# Patient Record
Sex: Male | Born: 1950 | Race: White | Hispanic: No | Marital: Married | Smoking: Never smoker
Health system: Southern US, Community
[De-identification: ages and names within clinical notes are randomized; demographics above are authoritative.]

## PROBLEM LIST (undated history)

## (undated) DIAGNOSIS — M1990 Unspecified osteoarthritis, unspecified site: Secondary | ICD-10-CM

## (undated) DIAGNOSIS — E119 Type 2 diabetes mellitus without complications: Secondary | ICD-10-CM

## (undated) DIAGNOSIS — E785 Hyperlipidemia, unspecified: Secondary | ICD-10-CM

## (undated) DIAGNOSIS — I10 Essential (primary) hypertension: Secondary | ICD-10-CM

## (undated) HISTORY — PX: PROSTATE SURGERY: SHX751

## (undated) HISTORY — DX: Hyperlipidemia, unspecified: E78.5

## (undated) HISTORY — DX: Unspecified osteoarthritis, unspecified site: M19.90

## (undated) HISTORY — DX: Essential (primary) hypertension: I10

## (undated) HISTORY — DX: Type 2 diabetes mellitus without complications: E11.9

---

## 2020-01-15 ENCOUNTER — Encounter (INDEPENDENT_AMBULATORY_CARE_PROVIDER_SITE_OTHER): Payer: Self-pay

## 2020-01-16 ENCOUNTER — Ambulatory Visit (INDEPENDENT_AMBULATORY_CARE_PROVIDER_SITE_OTHER): Payer: Self-pay

## 2020-01-16 DIAGNOSIS — Z23 Encounter for immunization: Secondary | ICD-10-CM

## 2020-02-06 ENCOUNTER — Encounter (INDEPENDENT_AMBULATORY_CARE_PROVIDER_SITE_OTHER): Payer: Self-pay

## 2020-02-07 ENCOUNTER — Encounter (INDEPENDENT_AMBULATORY_CARE_PROVIDER_SITE_OTHER): Payer: Self-pay

## 2020-02-08 ENCOUNTER — Ambulatory Visit (INDEPENDENT_AMBULATORY_CARE_PROVIDER_SITE_OTHER): Payer: Self-pay

## 2020-02-08 DIAGNOSIS — Z23 Encounter for immunization: Secondary | ICD-10-CM

## 2021-08-27 ENCOUNTER — Encounter (INDEPENDENT_AMBULATORY_CARE_PROVIDER_SITE_OTHER): Payer: Self-pay | Admitting: Cardiovascular Disease

## 2021-08-27 ENCOUNTER — Ambulatory Visit (INDEPENDENT_AMBULATORY_CARE_PROVIDER_SITE_OTHER): Payer: No Typology Code available for payment source | Admitting: Cardiovascular Disease

## 2021-08-27 VITALS — BP 154/90 | HR 66 | Ht 72.0 in | Wt 210.0 lb

## 2021-08-27 DIAGNOSIS — I4510 Unspecified right bundle-branch block: Secondary | ICD-10-CM

## 2021-08-27 DIAGNOSIS — I2584 Coronary atherosclerosis due to calcified coronary lesion: Secondary | ICD-10-CM

## 2021-08-27 DIAGNOSIS — R9431 Abnormal electrocardiogram [ECG] [EKG]: Secondary | ICD-10-CM

## 2021-08-27 NOTE — Progress Notes (Signed)
[facility] OFF[facility] CONSULTATION NOTE    [facility] OFFICE -CARDIOLOGY  [address]  [place] [address]  Dept: [phone]  Dept Fax: [phone]     Patient Name: Allen Oliver    Date of Visit:  August 27, 2021  Date of Birth: 1950-12-02  AGE: 70 y.o.  Medical Record #: [id number]  Requesting Physician: Everardo Beals, MD    CHIEF COMPLAINT:  Abnormal ECG      HISTORY OF PRESENT ILLNESS    Allen Oliver is being seen today for cardiovascular evaluation at the request of Everardo Beals, MD. He is a pleasant 70 y.o. male who has been referred for cardiovascular evaluation of an abnormal ECG.    [place] is [place].  Still working full-time.  Asymptomatic with no chest pain.    Has a quite active physical fitness routine doing stationary bike for 1 hour at a brisk pace 5 days weekly without limitations.  He knowledges his diet could be better and he would like to lose 10 pounds as well.  He has cut out meat and is just eating vegetables and fish for the past 14 days.  He is going to try to continue this.    I reviewed recent lipids showing LDL at 154.  This prompted his PCP to initiate atorvastatin.    He does not currently endorse any chest pain, shortness of breath, dyspnea on exertion, orthopnea, PND, edema, palpitations, nausea, diaphoresis, light-headedness, dizziness, syncope, or unusual bleeding.    PAST MEDICAL HISTORY: He has no past medical history on file. He has no past surgical history on file.    ALLERGIES: Not on File    MEDICATIONS:   Current Outpatient Medications   Medication Instructions    atorvastatin (LIPITOR) 20 mg, Oral, At bedtime    metFORMIN (GLUCOPHAGE) 500 mg, Oral, 2 times daily    testosterone cypionate (DEPO-TESTOSTERONE) 200 MG/ML injection INJECT INTRAMSCULAR TWICE A MONTH    testosterone cypionate (DEPO-TESTOSTERONE) 200 MG/ML injection testosterone cypionate 200 mg/mL intramuscular oil        FAMILY  HISTORY: family history is not on file.    SOCIAL HISTORY: He reports that he has never smoked. He has never used smokeless tobacco.    REVIEW OF SYSTEMS:   General: Denies recent weight loss, weight gain, fever or chills or change in exercise tolerance.;   Integumentary: Denies any change in hair or nails, rashes, or skin lesions.;   Eyes: Denies diplopia, glaucoma or visual field defects.;   Ears, Nose, Throat, Mouth: Denies any hearing loss, epistaxis, hoarseness or difficulty speaking.;  Respiratory: Denies dyspnea, cough, wheezing or hemoptysis.;   Cardiovascular: Please review HPI;   Abdominal : Denies ulcer disease, hematochezia or melena.;  Musculoskeletal:Denies any venous insufficiency, arthritic symptoms or back problems.;   Neurological : Denies any recurrent strokes, TIA, or seizure disorder.;   Psychiatric: Denies any depression, substance abuse or change in cognitive functions.;   Endocrine: Denies any weight change, heat/cold intolerance, polydipsia, or polyuria;   Hematologic/Immunologic: Denies any food allergies, seasonal allergies, bleeding disorders.   All other systems reviewed and negative except as above.     PHYSICAL EXAMINATION  Visit Vitals  BP 154/90 (BP Site: Left arm, Patient Position: Sitting, Cuff Size: Small)   Pulse 66   Ht 1.829 m (6')   Wt 95.3 kg (210 lb)   BMI 28.48 kg/m      GENERAL: NAD,  appears stated age  HEENT: No scleral icterus or conjunctival pallor, moist mucous membranes   NECK: No JVD, normal carotid upstrokes without bruits   CARDIAC: Normal rate, regular rhythm, normal S1 and S2, and no murmurs, rubs, or gallops   CHEST: Clear to auscultation bilaterally with normal respiratory effort, no wheezes or rales  ABDOMEN: Soft, non-tender, non-distended, normal bowel sounds  EXTREMITIES: No edema, 2+ DP/radial pulses bilaterally  SKIN: No rash or jaundice; warm and dry to touch  NEUROLOGIC: Alert and oriented to time, place and person; normal mood and affect; no gross  motor or sensory deficits noted  MUSCULOSKELETAL: Normal muscle strength and tone bilaterally/symmetrically    ECG (September 30th, 2022 @ PCP): Normal sinus rhythm with right bundle branch block, possible left atrial enlargement, normal axis      IMPRESSION:   Allen Oliver is a 70 y.o. 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  Subclinical coronary calcification by calcium scoring on August 23, 2021, with calcium score 95 (41st percentile).  Dyslipidemia with LDL 154, HDL 51, triglycerides 91, total cholesterol 223 on recent PCP labs in September, 2022 - appropriately started on atorvastatin 20 mg based on those numbers.  Elevated blood pressure reading without a diagnosis of hypertension, though suspect he may actually have stage I hypertension based on his account of ambulatory monitor readings generally in the 140s-150s/80s.  Prediabetes with hemoglobin A1c 6.0% in September, 2022.      RECOMMENDATIONS:    Echocardiogram to assess for structural heart disease with particular attention to the right ventricle/pulmonary pressure/tricuspid valve as well as potential parameters indicative of hypertensive heart disease.  Treadmill nuclear stress testing for coronary ischemic screening given risk profile.  If there is evidence of hypertensive heart disease on the echocardiogram and/work a hypertensive exercise response during his stress test, I would strongly advise he is labeled stage I hypertension and treated accordingly.  Risk factor modification is ongoing with Dr. Jeannene Patella patient seems quite motivated to improve dietary habits and drop another 10 pounds or so.  He states historically this has had significantly impactful improvements on the blood pressure.                                                 Orders Placed This Encounter   Procedures    NM Myocardial Perfusion Spect (Stress And Rest)    Echocardiogram Adult Complete W Clr/ Dopp Waveform         No orders of the  defined types were placed in this encounter.        SIGNED:    Arcola Jansky, MD         This note was generated by the Dragon speech recognition and may contain errors or omissions not intended by the user. Grammatical errors, random word insertions, deletions, pronoun errors, and incomplete sentences are occasional consequences of this technology due to software limitations. Not all errors are caught or corrected. If there are questions or concerns about the content of this note or information contained within the body of this dictation, they should be addressed directly with the author for clarification.

## 2021-09-02 ENCOUNTER — Encounter (INDEPENDENT_AMBULATORY_CARE_PROVIDER_SITE_OTHER): Payer: Self-pay

## 2022-01-06 ENCOUNTER — Encounter (INDEPENDENT_AMBULATORY_CARE_PROVIDER_SITE_OTHER): Payer: Self-pay | Admitting: Cardiovascular Disease

## 2022-01-24 ENCOUNTER — Inpatient Hospital Stay
Admission: RE | Admit: 2022-01-24 | Discharge: 2022-01-24 | Disposition: A | Discharge status: Home / Self Care | Payer: No Typology Code available for payment source | Source: Ambulatory Visit | Attending: Cardiovascular Disease | Admitting: Cardiovascular Disease

## 2022-01-24 DIAGNOSIS — E785 Hyperlipidemia, unspecified: Secondary | ICD-10-CM | POA: Insufficient documentation

## 2022-01-24 DIAGNOSIS — I2510 Atherosclerotic heart disease of native coronary artery without angina pectoris: Secondary | ICD-10-CM | POA: Insufficient documentation

## 2022-01-24 DIAGNOSIS — I2584 Coronary atherosclerosis due to calcified coronary lesion: Secondary | ICD-10-CM | POA: Insufficient documentation

## 2022-01-24 DIAGNOSIS — R931 Abnormal findings on diagnostic imaging of heart and coronary circulation: Secondary | ICD-10-CM | POA: Insufficient documentation

## 2022-01-24 DIAGNOSIS — R9431 Abnormal electrocardiogram [ECG] [EKG]: Secondary | ICD-10-CM | POA: Insufficient documentation

## 2022-01-24 DIAGNOSIS — I4510 Unspecified right bundle-branch block: Secondary | ICD-10-CM | POA: Insufficient documentation

## 2022-01-24 MED ORDER — TECHNETIUM TC 99M TETROFOSMIN IV KIT
10.0000 | PACK | Freq: Once | INTRAVENOUS | Status: AC | PRN
Start: 2022-01-24 — End: 2022-01-24
  Administered 2022-01-24: 07:00:00 10 via INTRAVENOUS
  Filled 2022-01-24: qty 100

## 2022-01-24 MED ORDER — TECHNETIUM TC 99M TETROFOSMIN IV KIT
35.0000 | PACK | Freq: Once | INTRAVENOUS | Status: AC | PRN
Start: 2022-01-24 — End: 2022-01-24
  Administered 2022-01-24: 08:00:00 35 via INTRAVENOUS
  Filled 2022-01-24: qty 100

## 2022-01-25 ENCOUNTER — Encounter (INDEPENDENT_AMBULATORY_CARE_PROVIDER_SITE_OTHER): Payer: Self-pay | Admitting: Cardiovascular Disease

## 2022-02-17 ENCOUNTER — Encounter (INDEPENDENT_AMBULATORY_CARE_PROVIDER_SITE_OTHER): Payer: Self-pay | Admitting: Cardiovascular Disease

## 2022-02-17 ENCOUNTER — Ambulatory Visit
Admission: RE | Admit: 2022-02-17 | Discharge: 2022-02-17 | Disposition: A | Discharge status: Home / Self Care | Payer: No Typology Code available for payment source | Source: Ambulatory Visit | Attending: Cardiovascular Disease | Admitting: Cardiovascular Disease

## 2022-02-17 DIAGNOSIS — I77810 Thoracic aortic ectasia: Secondary | ICD-10-CM | POA: Insufficient documentation

## 2022-02-17 DIAGNOSIS — R9431 Abnormal electrocardiogram [ECG] [EKG]: Secondary | ICD-10-CM | POA: Insufficient documentation

## 2022-02-17 DIAGNOSIS — I4510 Unspecified right bundle-branch block: Secondary | ICD-10-CM | POA: Insufficient documentation

## 2022-02-17 DIAGNOSIS — I2584 Coronary atherosclerosis due to calcified coronary lesion: Secondary | ICD-10-CM | POA: Insufficient documentation

## 2022-02-17 DIAGNOSIS — I5189 Other ill-defined heart diseases: Secondary | ICD-10-CM | POA: Insufficient documentation

## 2022-02-17 DIAGNOSIS — I517 Cardiomegaly: Secondary | ICD-10-CM | POA: Insufficient documentation

## 2022-02-21 ENCOUNTER — Encounter (INDEPENDENT_AMBULATORY_CARE_PROVIDER_SITE_OTHER): Payer: Self-pay | Admitting: Cardiovascular Disease

## 2022-10-19 ENCOUNTER — Encounter (INDEPENDENT_AMBULATORY_CARE_PROVIDER_SITE_OTHER): Payer: Self-pay

## 2023-06-26 ENCOUNTER — Ambulatory Visit: Payer: No Typology Code available for payment source

## 2023-06-26 NOTE — PSS Phone Screening (Signed)
Pre-Anesthesia Evaluation    Pre-op phone visit requested by:   Reason for pre-op phone visit: Patient anticipating RIGHT ARTHROPLASTY, SHOULDER REVERSE, REPAIR, TENDON, MAJOR procedure.         No orders of the defined types were placed in this encounter.      History of Present Illness/Summary:        Problem List:  Medical Problems       Hospital Problem List  Date Reviewed: 08/27/2021   None        Non-Hospital Problem List  Date Reviewed: 08/27/2021   None       Medical History   Diagnosis Date    Arthritis     Hyperlipidemia     Hypertension     Type 2 diabetes mellitus, controlled      Past Surgical History:   Procedure Laterality Date    PROSTATE SURGERY          Medication List            Accurate as of June 26, 2023  9:15 AM. Always use your most recent med list.                atorvastatin 20 MG tablet  Take 1 tablet (20 mg) by mouth nightly  Commonly known as: LIPITOR  Medication Adjustments for Surgery: Take as prescribed     metFORMIN 500 MG tablet  Take 1 tablet (500 mg) by mouth daily  Commonly known as: GLUCOPHAGE  Medication Adjustments for Surgery: Take morning of surgery     olmesartan 40 MG tablet  Take 1 tablet (40 mg) by mouth nightly as directed  Commonly known as: BENICAR  Medication Adjustments for Surgery: Take as prescribed     Ozempic (0.25 or 0.5 MG/DOSE) 2 MG/3ML  Inject 0.25 mg into the skin Once each week on Friday morning  Generic drug: semaglutide  Medication Adjustments for Surgery: Stop now  Notes to patient: Last dose was 06/23/23     sildenafil 20 MG tablet  Take 1 tablet (20 mg) by mouth as needed  Commonly known as: REVATIO  Medication Adjustments for Surgery: Last dose 24 hours before surgery     testosterone cypionate 200 MG/ML injection  INJECT INTRAMSCULAR TWICE A MONTH  Commonly known as: DEPO-TESTOSTERONE  Medication Adjustments for Surgery: Take as prescribed            Allergies[1]  History reviewed. No pertinent family history.  Social History     Occupational  History    Not on file   Tobacco Use    Smoking status: Never    Smokeless tobacco: Never   Vaping Use    Vaping status: Never Used   Substance and Sexual Activity    Alcohol use: Never    Drug use: Never    Sexual activity: Not on file           Exam Scores:   SDB score           STBUR score       PONV score  Nausea Risk: MODERATE RISK    MST score  MST Score: 0    PEN-FAST score       Frailty score  CFS Score: 3    CHADsVasc            Visit Vitals  Ht 1.829 m (6')   Wt 95.3 kg (210 lb)   BMI 28.48 kg/m   Overweight based on BMI.                                     [  1] No Known Allergies

## 2023-07-05 NOTE — Discharge Instr - AVS First Page (Addendum)
Dr. Nagda's Post-Op Instructions for Shoulder Replacement - Outpatient        DIET  * You may resume clear liquids and light foods after surgery (jello, soups, etc.)  * Progress to your normal diet as tolerated if you are not nauseated      PAIN MEDICATIONS    * Your nerve block will keep your arm numb after surgery for approximately 24-36 hours and possibly longer.      * Start taking the prescribed pain medication between: ____________________________________________ if you have any pain, OR take your first pill when your pain level is a 1 out of 10. You want to get ahead of the pain while the block is still working. Waiting until the block completely wears off may result in the medication not being effective enough to manage the pain.   * Common side effects of the pain medication are nausea, drowsiness, itching, and constipation. Take medication with food to decrease these effects.   * If you have persistent nausea or vomiting, contact the office and we may be able to switch your medication or prescribe you an anti-nausea medication.  * We will prescribe an anti-nausea medication. Take this about 20-30 minutes before the pain medication.   * We strongly recommend taking an over-the-counter laxative and stool softener while on pain medication.  * Over the counter Benadryl can be used for itching.  * Do not drive a car or operate machinery while taking narcotic medication  * Ibuprofen (motrin, advil) up to 400mg may be taken every 6 hours in addition to the narcotic pain medication to assist in pain relief if you are not taking a blood thinner. Do not take more than 1,600 mg in a 24 hour period.   * Tylenol(Acetaminophen) 500-1000mg (1-2 tablets) may be taken every 6 hours in addition to the narcotic pain medication to assist in pain relief. Do not take more than 3,000mg of tylenol(acetaminophen) in a 24 hour period.  * Tylenol and Ibuprofen can be taken together or alternating every 3 hours.  You should not take  Alleve or any other NSAID (anti-inflammatory med) if you are taking Ibuprofen.  If you have a regular NSAID (anti-inflammatory med) that you prefer, you can take this in lieu of Ibuprofen.        BLOOD THINNING MEDICATIONS  *If you take a blood thinning medication you will resume this medication after surgery based on your prescribing doctors instructions.      *If you do NOT take a blood thinning medication:   PLEASE TAKE A BABY ASPIRIN (81MG) TWO TIMES PER DAY until seen back in the office, this will aid in the prevention of blood clots    *You should alert Dr. Nagda if you have a history of bleeding disorders, bleeding ulcers, blood clots, are already taking a blood thinner or have intolerance to Aspirin.     WOUND CARE  * Remove the dressing 7 days after surgery. Your dressing will consist of white gauze with clear tape over the top. Surgical glue is also used on the incision. It is normal to see a film over the incision after dressing removal. Do not try to remove this glue.   * To shower, apply a large piece of saran wrap over the shoulder. Remove the wrap once the shower is complete. PLEASE KEEP YOUR SHOULDER DRY UNTIL YOUR FIRST POST-OP APPOINTMENT.  * Do not submerge the incisions in a bath or pool until instructed by Dr. Nagda.   *   Do not apply any creams, lotions, or ointments to the surgical incision until instructed to do so by Dr. Nagda.  * You may remove the sling to shower even if instructed to keep it on at all times. Please keep your arm by your side when out of the sling to shower.    ACTIVITY  * You will find that sleeping in a slightly upright position (i.e. reclining chair) with a pillow under the forearm and/or behind the shoulder will likely be the most comfortable position.  * Avoid long distance traveling and being seated or lying for long periods of time after surgery.   * NO driving until you have been seen back in the office.  * You may return to sedentary work ONLY or school 7-10 days  after surgery, if your pain is tolerable.  * We encourage you to walk around and be active as soon as you feel comfortable.  However, do not use the arm that has been operated on for any significant activity.    * You can use your hand for writing and typing, but no lifting or carrying with the arm that has undergone surgery.   * You should wear your sling at all times except for showering and performing gentle elbow ROM exercises. These exercises should be shown to you at your pre-op appointment or in the recovery room.  * You may remove the sling and move your elbow and wrist 4-5 times per day.    * You must avoid twisting your arm out to the side or reaching your arm behind your back until cleared to do so by Dr. Nagda.    COLD THERAPY   * Begin using ice packs or machine immediately after surgery.   * Ice should be applied no more than every hour for 20 minutes, (while awake) until your first post-operative visit. Do not place the ice packs directly in contact with skin.  EXERCISE  * Perform gentle deep breathing exercises, (take a deep breath and fully exhale) 5-10 times per hour during the first several days after surgery.   *You will be shown some gentle shoulder exercises at your pre-op appointment with Dr. Nagda or in the recovery room. You should continue these until your first post-op visit.  * You may come out of the sling 4-5 times per day to move the elbow and wrist.   * Begin Physical Therapy after your first post-op visit with Dr. Nagda or his PA. Please call to make this PT appointment prior to surgery. We will give you the prescription for PT at your first post-op visit.  * Once you start therapy, the therapist will show you more exercises to perform at home. These should be done 3-4 times per day.     THINGS TO LOOK OUT FOR  * Contact Dr. Nagda or the on-call physician at [phone] at any time if you notice any of the following signs or symptoms:    * Painful swelling that persists beyond 24  hours  * Unrelenting pain that is not relieved by the medication  * Fever over 101.5F after the first 2 days following surgery. You may have a fever within the first 2 days of surgery. This may be a sign of inactivity. Make sure you are performing your deep breathing exercises.   * Redness around the incisions  * Color change or excessive swelling in your wrist or hand  * Continuous drainage or bleeding from the surgical wound that   persists after the first 4-5 days after surgery (a small amount of drainage is expected)  * Difficulty breathing  * Excessive nausea/vomiting  * Swelling in either of your legs  * Anything else that concerns you    FOLLOW-UP CARE/QUESTIONS  * We will give you a call the first day after your surgery to see how you are doing. We'll be happy to answer any questions or address any concerns at that time. Our office number is [phone].

## 2023-07-06 ENCOUNTER — Ambulatory Visit
Payer: No Typology Code available for payment source | Admitting: Student in an Organized Health Care Education/Training Program

## 2023-07-06 ENCOUNTER — Ambulatory Visit: Payer: No Typology Code available for payment source

## 2023-07-06 ENCOUNTER — Ambulatory Visit
Admission: RE | Admit: 2023-07-06 | Discharge: 2023-07-06 | Disposition: A | Discharge status: Home / Self Care | Payer: No Typology Code available for payment source | Source: Ambulatory Visit | Attending: Orthopaedic Surgery | Admitting: Orthopaedic Surgery

## 2023-07-06 ENCOUNTER — Encounter
Admission: RE | Disposition: A | Discharge status: Home / Self Care | Payer: Self-pay | Source: Ambulatory Visit | Attending: Orthopaedic Surgery

## 2023-07-06 ENCOUNTER — Encounter: Payer: Self-pay | Admitting: Orthopaedic Surgery

## 2023-07-06 DIAGNOSIS — E782 Mixed hyperlipidemia: Secondary | ICD-10-CM | POA: Insufficient documentation

## 2023-07-06 DIAGNOSIS — Z7985 Long-term (current) use of injectable non-insulin antidiabetic drugs: Secondary | ICD-10-CM | POA: Insufficient documentation

## 2023-07-06 DIAGNOSIS — M75111 Incomplete rotator cuff tear or rupture of right shoulder, not specified as traumatic: Secondary | ICD-10-CM | POA: Insufficient documentation

## 2023-07-06 DIAGNOSIS — M7521 Bicipital tendinitis, right shoulder: Secondary | ICD-10-CM | POA: Diagnosis present

## 2023-07-06 DIAGNOSIS — I10 Essential (primary) hypertension: Secondary | ICD-10-CM | POA: Insufficient documentation

## 2023-07-06 DIAGNOSIS — M19011 Primary osteoarthritis, right shoulder: Secondary | ICD-10-CM | POA: Diagnosis present

## 2023-07-06 DIAGNOSIS — E230 Hypopituitarism: Secondary | ICD-10-CM | POA: Insufficient documentation

## 2023-07-06 DIAGNOSIS — E119 Type 2 diabetes mellitus without complications: Secondary | ICD-10-CM | POA: Insufficient documentation

## 2023-07-06 DIAGNOSIS — Z7984 Long term (current) use of oral hypoglycemic drugs: Secondary | ICD-10-CM | POA: Insufficient documentation

## 2023-07-06 HISTORY — PX: REPAIR, TENDON, MAJOR: SHX5316

## 2023-07-06 HISTORY — PX: ARTHROPLASTY SHOULDER REVERSE: SHX51021

## 2023-07-06 LAB — WHOLE BLOOD GLUCOSE POCT: Whole Blood Glucose POCT: 111 mg/dL — ABNORMAL HIGH (ref 70–100)

## 2023-07-06 SURGERY — ARTHROPLASTY, SHOULDER REVERSE
Anesthesia: Anesthesia General | Site: Shoulder | Laterality: Right | Wound class: Clean

## 2023-07-06 MED ORDER — GLYCOPYRROLATE 0.2 MG/ML IJ SOLN (WRAP)
INTRAMUSCULAR | Status: DC | PRN
Start: 2023-07-06 — End: 2023-07-06
  Administered 2023-07-06: .2 mg via INTRAVENOUS

## 2023-07-06 MED ORDER — ACETAMINOPHEN 500 MG PO TABS
1000.0000 mg | ORAL_TABLET | Freq: Four times a day (QID) | ORAL | Status: DC | PRN
Start: 2023-07-06 — End: 2023-07-06

## 2023-07-06 MED ORDER — PHENYLEPHRINE 100 MCG/ML IV SYRINGE FOR INFUSION (ANESTHESIA)
PREFILLED_SYRINGE | INTRAVENOUS | Status: DC | PRN
Start: 2023-07-06 — End: 2023-07-06
  Administered 2023-07-06: 25 ug/min via INTRAVENOUS

## 2023-07-06 MED ORDER — FENTANYL CITRATE (PF) 50 MCG/ML IJ SOLN (WRAP)
INTRAMUSCULAR | Status: DC | PRN
Start: 2023-07-06 — End: 2023-07-06
  Administered 2023-07-06: 100 ug via INTRAVENOUS

## 2023-07-06 MED ORDER — MIDAZOLAM HCL 1 MG/ML IJ SOLN (WRAP)
INTRAMUSCULAR | Status: AC
Start: 2023-07-06 — End: ?
  Filled 2023-07-06: qty 2

## 2023-07-06 MED ORDER — BUPIVACAINE HCL (PF) 0.5 % IJ SOLN
INTRAMUSCULAR | Status: DC | PRN
Start: 2023-07-06 — End: 2023-07-06
  Administered 2023-07-06: 15 mL via PERINEURAL

## 2023-07-06 MED ORDER — DEXAMETHASONE SOD PHOSPHATE PF 10 MG/ML IJ SOLN
INTRAMUSCULAR | Status: AC
Start: 2023-07-06 — End: ?
  Filled 2023-07-06: qty 1

## 2023-07-06 MED ORDER — HYDROMORPHONE HCL 1 MG/ML IJ SOLN
0.5000 mg | INTRAMUSCULAR | Status: DC | PRN
Start: 2023-07-06 — End: 2023-07-06

## 2023-07-06 MED ORDER — ACETAMINOPHEN 500 MG PO TABS
1000.0000 mg | ORAL_TABLET | Freq: Once | ORAL | Status: AC
Start: 2023-07-06 — End: 2023-07-06
  Administered 2023-07-06: 1000 mg via ORAL

## 2023-07-06 MED ORDER — CELECOXIB 200 MG PO CAPS
400.0000 mg | ORAL_CAPSULE | Freq: Once | ORAL | Status: AC
Start: 2023-07-06 — End: 2023-07-06
  Administered 2023-07-06: 400 mg via ORAL

## 2023-07-06 MED ORDER — VANCOMYCIN HCL 1 G IV SOLR
INTRAVENOUS | Status: AC
Start: 2023-07-06 — End: ?
  Filled 2023-07-06: qty 1000

## 2023-07-06 MED ORDER — SUGAMMADEX SODIUM 200 MG/2ML IV SOLN
INTRAVENOUS | Status: DC | PRN
Start: 2023-07-06 — End: 2023-07-06
  Administered 2023-07-06: 200 mg via INTRAVENOUS

## 2023-07-06 MED ORDER — PROPOFOL 10 MG/ML IV EMUL (WRAP)
INTRAVENOUS | Status: AC
Start: 2023-07-06 — End: ?
  Filled 2023-07-06: qty 40

## 2023-07-06 MED ORDER — PHENYLEPHRINE HCL 10 MG/ML IV SOLN (WRAP)
Status: AC
Start: 2023-07-06 — End: ?
  Filled 2023-07-06: qty 1

## 2023-07-06 MED ORDER — ONDANSETRON HCL 4 MG/2ML IJ SOLN
4.0000 mg | Freq: Once | INTRAMUSCULAR | Status: DC | PRN
Start: 2023-07-06 — End: 2023-07-06

## 2023-07-06 MED ORDER — PREGABALIN 75 MG PO CAPS
ORAL_CAPSULE | ORAL | Status: AC
Start: 2023-07-06 — End: ?
  Filled 2023-07-06: qty 1

## 2023-07-06 MED ORDER — BUPIVACAINE HCL (PF) 0.5 % IJ SOLN
INTRAMUSCULAR | Status: AC
Start: 2023-07-06 — End: ?
  Filled 2023-07-06: qty 10

## 2023-07-06 MED ORDER — TRANEXAMIC ACID-NACL 1000-0.7 MG/100ML-% IV SOLN
1000.0000 mg | Freq: Once | INTRAVENOUS | Status: AC
Start: 2023-07-06 — End: 2023-07-06
  Administered 2023-07-06: 1000 mg via INTRAVENOUS

## 2023-07-06 MED ORDER — ROCURONIUM BROMIDE 10 MG/ML IV SOLN (WRAP)
INTRAVENOUS | Status: DC | PRN
Start: 2023-07-06 — End: 2023-07-06
  Administered 2023-07-06: 40 mg via INTRAVENOUS
  Administered 2023-07-06: 10 mg via INTRAVENOUS

## 2023-07-06 MED ORDER — EPHEDRINE SULFATE 50 MG/ML IJ/IV SOLN (WRAP)
Status: AC
Start: 2023-07-06 — End: ?
  Filled 2023-07-06: qty 1

## 2023-07-06 MED ORDER — LACTATED RINGERS IV SOLN
INTRAVENOUS | Status: DC
Start: 2023-07-06 — End: 2023-07-06

## 2023-07-06 MED ORDER — SUCCINYLCHOLINE CHLORIDE 20 MG/ML IJ SOLN
INTRAMUSCULAR | Status: DC | PRN
Start: 2023-07-06 — End: 2023-07-06
  Administered 2023-07-06: 100 mg via INTRAVENOUS

## 2023-07-06 MED ORDER — VANCOMYCIN HCL 1 G IV SOLR
INTRAVENOUS | Status: DC | PRN
Start: 2023-07-06 — End: 2023-07-06
  Administered 2023-07-06: 1000 mg via TOPICAL

## 2023-07-06 MED ORDER — TRANEXAMIC ACID-NACL 1000-0.7 MG/100ML-% IV SOLN
INTRAVENOUS | Status: AC
Start: 2023-07-06 — End: ?
  Filled 2023-07-06: qty 100

## 2023-07-06 MED ORDER — PROPOFOL INFUSION 10 MG/ML
INTRAVENOUS | Status: DC | PRN
Start: 2023-07-06 — End: 2023-07-06
  Administered 2023-07-06: 200 mg via INTRAVENOUS

## 2023-07-06 MED ORDER — ONDANSETRON HCL 4 MG/2ML IJ SOLN
INTRAMUSCULAR | Status: DC | PRN
Start: 2023-07-06 — End: 2023-07-06
  Administered 2023-07-06: 4 mg via INTRAVENOUS

## 2023-07-06 MED ORDER — PHENYLEPHRINE 100 MCG/ML IV SOSY (WRAP)
PREFILLED_SYRINGE | INTRAVENOUS | Status: AC
Start: 2023-07-06 — End: ?
  Filled 2023-07-06: qty 10

## 2023-07-06 MED ORDER — CEFAZOLIN SODIUM 1 G IJ SOLR
INTRAMUSCULAR | Status: AC
Start: 2023-07-06 — End: ?
  Filled 2023-07-06: qty 2000

## 2023-07-06 MED ORDER — BUPIVACAINE LIPOSOME 1.3 % IJ SUSP
INTRAMUSCULAR | Status: AC
Start: 2023-07-06 — End: ?
  Filled 2023-07-06: qty 10

## 2023-07-06 MED ORDER — OXYCODONE HCL ER 10 MG PO T12A
10.0000 mg | EXTENDED_RELEASE_TABLET | Freq: Once | ORAL | Status: AC
Start: 2023-07-06 — End: 2023-07-06
  Administered 2023-07-06: 10 mg via ORAL

## 2023-07-06 MED ORDER — CELECOXIB 200 MG PO CAPS
ORAL_CAPSULE | ORAL | Status: AC
Start: 2023-07-06 — End: ?
  Filled 2023-07-06: qty 2

## 2023-07-06 MED ORDER — ACETAMINOPHEN 500 MG PO TABS
ORAL_TABLET | ORAL | Status: AC
Start: 2023-07-06 — End: ?
  Filled 2023-07-06: qty 2

## 2023-07-06 MED ORDER — STERILE WATER FOR INJECTION IJ/IV SOLN (WRAP)
2.0000 g | INTRAMUSCULAR | Status: AC
Start: 2023-07-06 — End: 2023-07-06
  Administered 2023-07-06: 2 g via INTRAVENOUS

## 2023-07-06 MED ORDER — CEFAZOLIN SODIUM 1 G IJ SOLR
INTRAMUSCULAR | Status: AC
Start: 2023-07-06 — End: ?
  Filled 2023-07-06: qty 1000

## 2023-07-06 MED ORDER — FENTANYL CITRATE (PF) 50 MCG/ML IJ SOLN (WRAP)
25.0000 ug | INTRAMUSCULAR | Status: DC | PRN
Start: 2023-07-06 — End: 2023-07-06

## 2023-07-06 MED ORDER — FENTANYL CITRATE (PF) 50 MCG/ML IJ SOLN (WRAP)
INTRAMUSCULAR | Status: AC
Start: 2023-07-06 — End: ?
  Filled 2023-07-06: qty 1

## 2023-07-06 MED ORDER — PREGABALIN 75 MG PO CAPS
75.0000 mg | ORAL_CAPSULE | Freq: Once | ORAL | Status: AC
Start: 2023-07-06 — End: 2023-07-06
  Administered 2023-07-06: 75 mg via ORAL

## 2023-07-06 MED ORDER — OXYCODONE HCL ER 10 MG PO T12A
EXTENDED_RELEASE_TABLET | ORAL | Status: AC
Start: 2023-07-06 — End: ?
  Filled 2023-07-06: qty 1

## 2023-07-06 MED ORDER — ASPIRIN 81 MG PO TBEC
81.0000 mg | DELAYED_RELEASE_TABLET | Freq: Two times a day (BID) | ORAL | Status: AC
Start: 2023-07-06 — End: 2023-07-20

## 2023-07-06 MED ORDER — OXYCODONE HCL 5 MG PO TABS
5.0000 mg | ORAL_TABLET | Freq: Once | ORAL | Status: DC
Start: 2023-07-06 — End: 2023-07-06

## 2023-07-06 MED ORDER — ONDANSETRON HCL 4 MG/2ML IJ SOLN
INTRAMUSCULAR | Status: AC
Start: 2023-07-06 — End: ?
  Filled 2023-07-06: qty 2

## 2023-07-06 MED ORDER — MIDAZOLAM HCL 1 MG/ML IJ SOLN (WRAP)
INTRAMUSCULAR | Status: DC | PRN
Start: 2023-07-06 — End: 2023-07-06
  Administered 2023-07-06: 2 mg via INTRAVENOUS

## 2023-07-06 MED ORDER — DEXAMETHASONE SODIUM PHOSPHATE 4 MG/ML IJ SOLN (WRAP)
INTRAMUSCULAR | Status: DC | PRN
Start: 2023-07-06 — End: 2023-07-06
  Administered 2023-07-06: 10 mg via INTRAVENOUS

## 2023-07-06 MED ORDER — BUPIVACAINE LIPOSOME 1.3 % IJ SUSP
INTRAMUSCULAR | Status: DC | PRN
Start: 2023-07-06 — End: 2023-07-06
  Administered 2023-07-06: 10 mL

## 2023-07-06 MED ORDER — GLYCOPYRROLATE 0.2 MG/ML IJ SOLN (WRAP)
INTRAMUSCULAR | Status: AC
Start: 2023-07-06 — End: ?
  Filled 2023-07-06: qty 1

## 2023-07-06 MED ORDER — EPHEDRINE SULFATE 50 MG/ML IJ/IV SOLN (WRAP)
Status: DC | PRN
Start: 2023-07-06 — End: 2023-07-06
  Administered 2023-07-06: 15 mg via INTRAVENOUS

## 2023-07-06 MED ORDER — ROCURONIUM BROMIDE 50 MG/5ML IV SOLN
INTRAVENOUS | Status: AC
Start: 2023-07-06 — End: ?
  Filled 2023-07-06: qty 5

## 2023-07-06 MED ORDER — OXYCODONE HCL ER 10 MG PO T12A
10.0000 mg | EXTENDED_RELEASE_TABLET | Freq: Once | ORAL | Status: DC | PRN
Start: 2023-07-06 — End: 2023-07-06

## 2023-07-06 MED ORDER — SUCCINYLCHOLINE CHLORIDE 20 MG/ML IJ SOLN
INTRAMUSCULAR | Status: AC
Start: 2023-07-06 — End: ?
  Filled 2023-07-06: qty 5

## 2023-07-06 MED ORDER — SUGAMMADEX SODIUM 200 MG/2ML IV SOLN
INTRAVENOUS | Status: AC
Start: 2023-07-06 — End: ?
  Filled 2023-07-06: qty 2

## 2023-07-06 SURGICAL SUPPLY — 87 items
ADHESIVE SKIN CLOSURE DERMABOND ADVANCED (Skin Closure) ×1
ADHESIVE SKIN CLOSURE DERMABOND ADVANCED .7 ML LIQUID APPLICATOR (Skin Closure) IMPLANT
BASEPLATE GLENOID 15 D FULL WEDGE AUGMENT OD25 MM PERFORM SHOULDER (Base) IMPLANT
BASEPLATE GLND 15D 25MM FULL WDG AUG (Base) ×1 IMPLANT
BIT DRILL L5 IN OD5/64 IN STANDARD (Drillbits) ×1
BIT DRILL L5 IN OD5/64 IN STANDARD BRASSELER USA STAINLESS STEEL TWIST (Drillbits) ×1 IMPLANT
BIT DRILL OD3.2 MM PERFORM REVERSE PERIPHERAL SCREW (Drillbits) IMPLANT
BIT DRL PRFRM 3.2MM STRL RVRS PERI SCR (Drillbits) ×1
BLADE SAW THK1.27 MM SAGITTAL L90 MM X (Blade) ×1
BLADE SAW THK1.27 MM SAGITTAL L90 MM X W18 MM (Blade) ×1 IMPLANT
DRESSING HDRCLD SCRL TGDRM 6.75INX6 3/8 (Dressing) IMPLANT
DRESSING TRANSPARENT L4 3/4 IN X W4 IN (Dressing) ×3
DRESSING TRANSPARENT L4 3/4 IN X W4 IN POLYURETHANE ADHESIVE (Dressing) ×3 IMPLANT
ELECTRODE ADULT PATIENT RETURN L9 FT REM POLYHESIVE ACRYLIC FOAM (Procedure Accessories) ×1 IMPLANT
ELECTRODE ELECTROSURGICAL BLADE L165 MM (Blade) ×1
ELECTRODE ELECTROSURGICAL BLADE L165 MM NEPTUNE E-SEP COATED (Blade) ×1 IMPLANT
ELECTRODE PATIENT RETURN L9 FT VALLEYLAB (Procedure Accessories) ×1
GLOVE SURGICAL 8 1/2 BIOGEL PI INDICATOR (Glove) ×1
GLOVE SURGICAL 8 1/2 BIOGEL PI INDICATOR UNDERGLOVE POWDER FREE SMOOTH (Glove) ×1 IMPLANT
GLOVE SURGICAL 8 1/2 BIOGEL SURGEONS (Glove) ×2
GLOVE SURGICAL 8 1/2 BIOGEL SURGEONS POWDER FREE BEAD CUFF TEXTURE (Glove) ×2 IMPLANT
GOWN SURGICAL XL BLUE AAMI LEVEL 4 BREATHABLE CSR WRAP PROTECTION (Gown) ×1 IMPLANT
GOWN SURGICAL XL MEDLINE BLUE AAMI LEVEL (Gown) ×1
GUIDE PIN ORTH 3MM 75MM STRL (Guide Pin) ×1
GUIDE PIN ORTHOPEDIC L75 MM OD3 MM SIMPLICI-TI (Guide Pin) IMPLANT
GUIDEWIRE ORTH 2.5MM 220MM GLND (Guide Pin) ×2
GUIDEWIRE ORTHOPEDIC AEQUALIS PERFORM OD2.5 MM L220 MM (Guide Pin) IMPLANT
HOOD SURGEON PEEL AWAY FACE SHIELD (Procedure Accessories) ×3
HOOD SURGEON PEEL AWAY FACE SHIELD T7PLUS (Procedure Accessories) ×3 IMPLANT
INSERT HUMERAL 10DEG 3/4 THK+0 MM REVERSED INSERT OD42 MM TORNIER (Insert) IMPLANT
INSERT HUMERAL H+0 MM 10 D 3/4 REVERSE (Insert) ×1 IMPLANT
KIT POSITIONING STABILIZATION SPIDER2 (Positioning Supplies) ×1
KIT POSITIONING STABILIZATION SPIDER2 SHOULDER (Positioning Supplies) ×1 IMPLANT
KIT RM TURNOVER LF NS DISP (Kits) ×1
KIT ROOM TURNOVER NONSTERILE LATEX FREE DISPOSABLE (Kits) ×1 IMPLANT
KIT SURGICAL BASIN 2 FOAK (Procedure Accessories) ×1
KIT SURGICAL BASIN 2 FOAK MEDLINE INDUSTRIES, INC. (Procedure Accessories) ×1 IMPLANT
MASK FACE T-MAX (Respiratory Supplies) ×1 IMPLANT
MAT OR L46 IN X W40 IN MEDIUM ABSORBENT (Procedure Accessories) ×1
MAT OR L46 IN X W40IN MD ABSRBNT FLUID BARRIER BACK SURGISAFE BLUE (Procedure Accessories) ×1 IMPLANT
NEEDLE SUTURE 1/2 CIRCLE L1.404 IN (Suture) ×1
NEEDLE SUTURE 1/2 CIRCLE L1.404 IN TROCAR POINT FREE EYE RICHARD-ALLAN (Suture) ×1 IMPLANT
NEEDLE SUTURE 5 1/2 CIRCLE L1.482 IN (Needles) ×1
NEEDLE SUTURE 5 1/2 CIRCLE L1.482 IN REGULAR EYE REVERSE CUT (Needles) ×1 IMPLANT
PAD ABDOMINAL L9 IN X W5 IN 4 SEAL EDGE (Dressing) IMPLANT
PENCIL SMOKE EVACUATOR L165 MM SUCTION (Sleeve) ×1
PENCIL SMOKE EVACUATOR L165 MM SUCTION SLEEVE NEPTUNE E-SEP (Sleeve) ×1 IMPLANT
POSITIONER OR COVERSET KNEEGRIP (Positioning Supplies) ×1 IMPLANT
ROD SCREW BN AQLS PRFRM 7MM LF STRL RVRS (Post) ×1 IMPLANT
SCREW BASEPLATE L22 MM REVERSE PERIPHERAL OD5 MM AEQUALIS PERFORM (Screw) IMPLANT
SCREW BASEPLATE L26 MM REVERSE PERIPHERAL OD5 MM AEQUALIS PERFORM (Screw) IMPLANT
SCREW BASEPLATE L30 MM REVERSE PERIPHERAL OD5 MM AEQUALIS PERFORM (Screw) IMPLANT
SCREW BSPLT AQLS PRFRM 5MM 26MM NS RVRS (Screw) ×1 IMPLANT
SCREW BSPLT AQLS PRFRM 5MM 30MM NS RVRS (Screw) ×2 IMPLANT
SCREW BSPLT AQLS PRFRM PVRS 5MM 22MM NS (Screw) ×1 IMPLANT
SCREW L7 MM REVERSE BONE AEQUALIS PERFORM (Post) IMPLANT
SEALER BIPOLAR MIS FLEX AQUAMANTYS (Instrument) IMPLANT
SEALER ELECTROSURGICAL L.226 IN 30 D (Cautery) ×1
SEALER ELECTROSURGICAL L.226 IN 30 D .173 IN SPACE BIPOLAR 2 ELECTRODE (Cautery) ×1 IMPLANT
SLEEVE CMPR MED KN LGTH KDL SCD 21- IN (Sleeve) ×1
SLEEVE COMPRESSION MEDIUM KNEE LENGTH KENDALL SEQUENTIAL OD21- IN (Sleeve) ×1 IMPLANT
SOLUTION ANTISEPTIC DEBRIDE AGENT (Prep) ×1 IMPLANT
SOLUTION IRRIGATION 0.9% SODIUM CHLORIDE (Irrigation Solutions) ×1 IMPLANT
SOLUTION IRRIGATION 0.9% SODIUM CHLORIDE 1000 ML PLASTIC POUR BOTTLE (Irrigation Solutions) IMPLANT
SOLUTION SURGICAL PREP 26 ML DURAPREP (Prep) ×2
SOLUTION SURGICAL PREP 26 ML DURAPREP 74% ISOPROPYL ALCOHOL 0.7% (Prep) ×2 IMPLANT
SPHERE GLENOID STANDARD REVERSE OD42 MM PERFORM SHOULDER (Head) IMPLANT
SPHERE GLND STD TORNIER AQLS PRFRM 42MM (Head) ×1 IMPLANT
SPONGE GAUZE L4 IN X W4 IN 12 PLY (Sponge) ×1 IMPLANT
SPONGE GAUZE L4 IN X W4 IN 16 PLY (Dressing) ×1
SPONGE GAUZE L4 IN X W4 IN 16 PLY MAXIMUM ABSORBENT USP TYPE VII (Dressing) IMPLANT
STEM HUM 3 PRFRM LF STRL SHLDR (Component) ×1 IMPLANT
STEM HUMERAL PERFORM 3 SHOULDER (Component) IMPLANT
STRAP POSITIONING L19 IN X W3.5 IN (Procedure Accessories) ×1
STRAP POSITIONING L19 IN X W3.5 IN STIRRUP SLIP RING TECLIN (Procedure Accessories) ×1 IMPLANT
SUTURE BLACK BLUE 2 L38 IN BRAID 2 (Suture) ×1
SUTURE BLACK BLUE WHITE 2 1/2 CIRCLE (Suture)
SUTURE BLCK BLUE 2 L38 IN BRD 2 STRAND LOWER KNOT PROFILE NNBSRBBL (Suture) ×1 IMPLANT
SUTURE BLUE 2 TAPER L7 IN X W2 MM BRAID (Suture)
SUTURE FIBERTAPE FIBERWIRE BLUE 2 TAPER L7 IN X W2 MM BRAID (Suture) IMPLANT
SUTURE FIBERWIRE BLACK BLUE WHITE 2 1/2 CIRCLE STRAIGHT DIAMOND POINT (Suture) IMPLANT
TOWEL L26 IN X W17 IN COTTON PREWASH (Procedure Accessories) ×3
TOWEL L26 IN X W17 IN COTTON PREWASH DELINT BLUE ACTISORB SURGICAL (Procedure Accessories) ×3 IMPLANT
TRAY SRG TOTAL SHOULDER IMVH (Pack) ×1
TRAY SURGICAL TOTAL SHOULDER MVH (Pack) ×1 IMPLANT
WATER STERILE PLASTIC POUR BOTTLE 1000 (Irrigation Solutions) ×2
WATER STERILE PLASTIC POUR BOTTLE 1000 ML (Irrigation Solutions) ×2 IMPLANT

## 2023-07-06 NOTE — OT Eval Note (Signed)
Occupational Therapy Evaluation Allen Oliver        Post Acute Care Therapy Recommendations:     Discharge Recommendations:  Home with supervision    DME needs IF patient is discharging home: No additional equipment/DME recommended at this time, Patient already has needed equipment    Therapy discharge recommendations may change with patient status.  Please refer to most recent note for up-to-date recommendations.       [facility]  [address]  [place], [place] [address]  [phone]    Occupational Therapy Evaluation    Patient: Allen Oliver MRN: [id number]   Unit: [facility] MAIN OPERATING ROOM Bed: MVMAINOR/MVMAINOR    Time of treatment:   OT Received On: 07/06/23  Start Time: 1207  Stop Time: 1245  Time Calculation (min): 38 min       Consult received for Allen Oliver for OT evaluation and treatment.  Patient's medical condition is appropriate for Occupational Therapy  intervention at this time.    Interpreter utilized: no, not indicated    Assessment   Allen Oliver is a 72 y.o. male admitted 07/06/2023 s/p R RSA. Per ortho pt NWB RUE, in sling at all times, hold ER to 0. At baseline pt lives with wife in a house with 2 STE, FOS inside up/down with R; bathroom setup walk in shower with no modifications. Pt PLOF indep for ADLs/IADLs, amb with no AD, driving, working. Pt received in PACU with wife Pension scheme manager) present, educated on role of OT in acute care setting and agreeable to initial evaluation. Pt and coach verbalize understanding of shoulder precautions, post op instructions, and HEP. Pt's coach independent to assist pt with sling management, dressing and toileting as needed.  Pt and coach educated on adaptive UB/LB dressing techniques and pt able to complete UBD with Min A and LBD with Mod A. Pt and coach educated on bathing tasks while maintaining shoulder precautions. Pt ambulated 57' with supv and went up/down 4 steps with R rail ascending with SBA.  All goals have been met and pt is safe  for discharge home with coach to assist.     Therapeutic Intervention (30 minutes):   Pt issued handouts on HEP, shoulder precautions and post op instructions.  Pt/Caregiver educated on shoulder precautions (No active ER, abduction or flexion of shoulder, no weight bearing to operated UE).  Pt/Caregiver educated/trained on proper technique to don and doff sling.  Pt/Caregiver educated/trained on modified upper body dressing technique.   Pt/Caregiver educated/trained on ROM exercises to affected UE (elbow flexion and extension, wrist flexion and extension, hand flexion and extension) and importance of performing breathing exercises.      Brief chart review completed including review of labs review of imaging review of vitals.  Pt's ability to complete ADLs and functional transfers is at/near fxn'l baseline. Pt does not have any acute OT needs at this time. D/C OT.         Complexity Chart Review Performance Deficits Clinical Decision Making Hx/Comorbidities Assistance needed   Low  Brief  1-3 Limited options None None (or at baseline)     PMP - Progressive Mobility Protocol   PMP Activity: Step 7 - Walks out of Room  Distance Walked (ft) (Step 6,7): 75 Feet        Interdisciplinary Communication: RN    Plan     Discharge from OT Acute Care Services.         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 Carla Drape, MD 07/06/2023 11:40 AM     Social History:  Lives with wife in a house.  Entry Steps: 2   Rails: N Inside steps: FOS up/down  Rails: Y  Equipment at home:  walk in shower  Prior Level of Function:    Cognition: WFL    Mobility: indep   Feeding: indep   Grooming: indep   Bathing: indep   Dressing: indep   Toileting: indep    Subjective   Patient is agreeable to participation in the therapy session. Nursing clears patient for therapy.  Patient's Goal:  to go home  Pain: pt denies pain and no evidence of pain noted during session    Objective   Patient is in bed with  intravenous access  in place.       Observation of patient/vitals:   Vitals:    07/06/23 1110 07/06/23 1114 07/06/23 1115 07/06/23 1120   BP: 112/60 110/57 108/57 105/59   Pulse: 66 62 69 64   Resp: 20 12 16 12    Temp: 97.4 F (36.3 C) 97 F (36.1 C)     TempSrc: Temporal      SpO2: 98% 98% 98% 100%   Weight:       Height:           Orientation/Cognition:     Alert and Oriented x 4  Cognition: follows all commands    Musculoskeletal Examination:          ROM Strength   Neck/ Trunk WFL WFL   RUE Distal WFL, proximal deferred due to sx Not formally assessed due to sx   LUE  [facility] WFL   RLE [facility] WFL   LLE [facility] WFL     Sensation: Intact to light touch, denies numbness/tingling throughout LUE  Coordination: Intact gross motor and serial opposition to B hands   Vision: WFL  Hearing: WFL    Functional Mobility:    Supine to sit: supv  Sit to Supine: supv  Sit to stand: supv  Stand to sit: supv  Transfers: w/c supv  Ambulation: 75' supv   Stairs: up/down SBA    Balance:  Static Sit Balance: good  Dynamic Sit Balance: good  Static Stand  Balance: fair  Dynamic Stand Balance: fair    Self Care:  UB Dressing: Min A  Sling Management:  Min A  LB Dressing: Mod A    UE Exercises: 1x10  Shoulder Shrugs  Elbow  Flexion/Extension  Wrist Flexion/Extension  Hand Open and Close    Endurance: good    Participation:  good    Education:  Educated the Personal assistant to role of occupational therapy, plan of care, goals  of therapy and HEP, safety with mobility and ADLs, energy conservation techniques, shoulder precautions, weight bearing precautions, discharge instructions, and home safety.      RN notified of session outcome and that patient was left in w/c with all needs met and equipment intact.   Safety measures include: handoff to nurse/clin tech/ unit secretary completed.   Mobility and ADL status posted at bedside and within E.M.R.    Goals:  Goals  Goal Formulation: Patient  Time For Goal Achievement: by time of discharge  Goals: Select goal  Patient will dress upper body: Minimal Assist;Goal met  Patient will dress lower body: Moderate Assist;Goal met  Other Goal: Pt/family will complete sling mgmt/edu; Min A; Goal met  Pt will perform functional transfers: Stand by Assist;Goal met  Pt will perform Home Exercise Program: modified independent;Goal met  Pt will follow weight bearing precautions: with supervision;100% of the time;with instruction;while performing ADLs;during functional transfers;to maintain safety and prevent injury;Goal met  Pt will follow shoulder precautions: with supervision;maintains while performing ADLs;to increase ability to complete ADLs;Goal met      Therapist PPE during session procedural mask and gloves     Signature:   Riccardo Dubin, OTD, OTR/L  07/06/2023  1:50 PM    (For scheduling questions, please contact rehab tech [phone])         [1]   Patient Active Problem List  Diagnosis   (none) - all problems resolved or deleted   [2]   Past Medical History:  Diagnosis Date    Arthritis     Hyperlipidemia     Hypertension      Type 2 diabetes mellitus, controlled

## 2023-07-06 NOTE — Progress Notes (Signed)
Fall Precautions interventions include the following due to sedation/anesthesia for procedures:    -Physical environment is free of clutter  -The patient is oriented to the environment  -Family members may be at the bedside when appropriate  -Stretchers are in the lowest position with wheels locked  -Non-skid socks are provided as foot covers  -Staff members assist the patient with ambulation to the bathroom  -Call bell in reach if staff member is away

## 2023-07-06 NOTE — Anesthesia Procedure Notes (Signed)
Peripheral Nerve Block    Patient location during procedure: Pre-Op  Reason for block: Post-op pain management      Injection technique: Single Shot  Block Region: Interscalene  Laterality: Right      Block at surgeon's request: Yes      Start time: 07/06/2023 7:50 AM  End time: 07/06/2023 7:56 AM      Staffing  Anesthesiologist: Loleta Rose, MD  Performed: Anesthesiologist       Pre-procedure Checklist   Completed: patient identified, surgical consent, pre-op evaluation, timeout performed, risks and benefits discussed, anesthesia consent given and correct site        Peripheral Block  Patient monitoring: Pulse oximetry, EKG and Face mask O2  Patient position: Sitting  Premedication: Meaningful contact maintained  Sterile technique: Chloraprep, Sterile gloves and Mask      Needle  Needle type: Stim needle   Needle gauge: 21 G  Needle length: 5 cm      Guidance: ultrasound; image taken and retained in medical record  Ultrasound Guided: LA spread visualized, Needle visualized and Image stored or printed          Assessment   Incremental injection: yes  Injection made incrementally with aspirations every 5 mL.  Injection Resistance: no  Paresthesia Pain: No    Blood Aspirated: No  no suspected intravascular injection  Patient tolerated procedure well: Yes  Block Outcome: No complications

## 2023-07-06 NOTE — Anesthesia Postprocedure Evaluation (Signed)
Anesthesia Post Evaluation    Patient: Allen Oliver    Procedure(s):  RIGHT ARTHROPLASTY, SHOULDER REVERSE  REPAIR, TENDON, MAJOR    Anesthesia type: general    Last Vitals:   Vitals Value Taken Time   BP 105/59 07/06/23 1120   Temp 36.1 C (97 F) 07/06/23 1114   Pulse 64 07/06/23 1120   Resp 12 07/06/23 1120   SpO2 100 % 07/06/23 1120                 Anesthesia Post Evaluation:     Patient Evaluated: PACU    Level of Consciousness: awake and alert  Pain Score: 0  Pain Management: adequate  Multimodal analgesia pain management approach  Strategies: regional block and local anesthesia  Airway Patency: patent  Two or more mitigation strategies used for obstructive sleep apnea.  Strategies: awake extubation and multimodal analgesia    Anesthetic complications: No      PONV Status: none    Cardiovascular status: acceptable  Respiratory status: acceptable  Hydration status: acceptable          Signed by: Loleta Rose, MD, 07/06/2023 12:34 PM

## 2023-07-06 NOTE — Transfer of Care (Signed)
Anesthesia Transfer of Care Note    Patient: Allen Oliver    Procedures performed: Procedure(s):  RIGHT ARTHROPLASTY, SHOULDER REVERSE  REPAIR, TENDON, MAJOR    Anesthesia type: General ETT    Patient location:Phase I PACU    Last vitals:   Vitals:    07/06/23 1114   BP: 110/57   Pulse: 62   Resp: 12   Temp: 36.1 C (97 F)   SpO2: 98%       Post pain: Patient not complaining of pain, continue current therapy      Mental Status:awake and alert     Respiratory Function: tolerating face mask    Cardiovascular: stable    Nausea/Vomiting: patient not complaining of nausea or vomiting    Hydration Status: adequate    Post assessment: no apparent anesthetic complications, no reportable events, and no evidence of recall    Signed by: Lavone Nian, CRNA  07/06/23 11:15 AM

## 2023-07-06 NOTE — Op Note (Signed)
DATE OF SURGERY: 07/06/2023    PREOPERATIVE DIAGNOSES:  1.  Right shoulder primary osteoarthritis.  2.  Right shoulder partial rotator cuff tear.     POSTOPERATIVE DIAGNOSES:  1.  Right shoulder primary osteoarthritis.  2.  Right shoulder partial rotator cuff tear.  3.  Right shoulder biceps tendon long head tenosynovitis     TITLE OF PROCEDURE:  1.  Right reverse total shoulder arthroplasty.  2.  Right shoulder open biceps tendon transfer    SURGEON:  Cleora Fleet, MD    ASSISTANT: Heber Carolina, PA  Jaymes Graff, SA    IMPLANTS: the implants included a size 3 Tornier Perform humeral stem  along with a 42-mm glenosphere standard, 42 mm x 0 mm R poly along with a  25-mm full wedge augmented baseplate.  Four screws were also utilized.     ANESTHESIA:  General and block     ESTIMATED BLOOD LOSS:  120 mL.     COMPLICATIONS:  None.     DISPOSITION:  To PACU, stable.     INDICATIONS FOR PROCEDURE:   The patient was seen in the office with pain in the anterior/lateral shoulder. Pain and weakness   Were noted with raising the arm. Mild Pseudoparalysis was noted and conservative  measures had failed.  We discussed treatment options in light of this.  We  discussed both operative and nonoperative measures.  The patient was  understanding of the different options and thought about it.  The patient  called back to schedule at their convenience and we elected to move forward  with reverse shoulder arthroplasty.  Further discussions regarding risks  and benefits as well as alternatives were detailed in the patient chart and the patient was  scheduled for reverse shoulder arthroplasty.     DESCRIPTION OF PROCEDURE:  On the operative day, I identified the patient in the holding area, marked  the right shoulder.  They were given 2 grams of antibiotics within 1 hour  prior to surgery.  SCDs were applied for DVT prophylaxis and the patient  was marked on the correct shoulder.  The patient was then taken into the  surgical suite,  placed in the beach chair position, and prepped and draped  in the usual sterile fashion.  At this point, a pause for safety assured  this was patient and the right shoulder was the correct shoulder.  We then  made a 10-cm incision centered over the deltopectoral groove.  We took it  down to the cephalic vein, took the vein medially, identified subdeltoid  adhesions, and found a thin but intact rotator cuff.  The axillary nerve  was then found and protected throughout the rest of the case.      The biceps was then identified and found to be covered in synovitis and thinned from a  Large spur.  As a result, we elected to cut and transfer the biceps to the  Pectoralis major muscle.  We went as distal as possible to find good tissue   And then used Ethibond suture and transferred it to the pec major tendon.     The subscapularis was then peeled off the lesser tuberosity, retractors were  placed in, and the humeral head was identified.  We then cut the humeral  head in 30 degrees of retroversion.  We then released more capsular tissue  inferiorly and then turned our attention to the glenoid.  Retractors were  placed in after releasing the subscapularis and we released  circumferentially the tissue off of the glenoid.  The central guide pin was  placed in, slightly inferior, and the glenoid was reamed.  The central  baseplate pin was then reamed over the top and the baseplate was placed in.  Four screws were then placed in, all of which achieved good fixation.  The  glenosphere was then placed in.       We then turned our attention back to the humerus and reamed for  the appropriate size.  We placed our poly on and achieved  good tension on the conjoined tendon and the deltoid.  We then felt happy  with the stability of the shoulder.  Everything was then taken out and  irrigated, and then we placed 2 drill holes into the bicipital groove.  We  then placed the actual implants in and placed a gram of vancomycin to  the  joint.  We then closed the subscapularis up.    After this was done, we then closed the patient up with 2-0 Vicryl, 3-0  Vicryl, 4-0 Monocryl stitches, applied a sterile dressing and a sling.  The patient was then awoken and taken to the recovery room in stable condition.      The assistant, Mrs. Carolin Coy, was vital for the entire case for constant retraction and  Suction.

## 2023-07-06 NOTE — Anesthesia Preprocedure Evaluation (Signed)
Anesthesia Evaluation    AIRWAY    Mallampati: II    TM distance: >3 FB  Neck ROM: full  Mouth Opening:full   CARDIOVASCULAR    cardiovascular exam normal       DENTAL    no notable dental hx               PULMONARY    pulmonary exam normal     OTHER FINDINGS                                          ===============================================================    ADDITIONAL MEDICAL HISTORY      No LMP for male patient.    There is no height or weight on file to calculate BMI.         Date of last liquid: 07/05/23  Time of last liquid: 1800   Date of last solid: 07/05/23  Time of last solid: 1800       Scheduled  Procedure(s):  RIGHT ARTHROPLASTY, SHOULDER REVERSE  REPAIR, TENDON, MAJOR  Attending Physician: Havery Moros, MD  Admission Date:07/06/2023      Assessment:    Problem List[1]       Past Medical History:   Diagnosis Date    Arthritis     Hyperlipidemia     Hypertension     Type 2 diabetes mellitus, controlled             Past Surgical History:   Procedure Laterality Date    PROSTATE SURGERY         Social History     Social History     Tobacco Use    Smoking status: Never    Smokeless tobacco: Never   Substance Use Topics    Alcohol use: Never      Allergies:   Allergies[2]  Hospital Medications:     Current Facility-Administered Medications   Medication Dose Route Frequency    ceFAZolin  2 g Intravenous Pre-Op    tranexamic acid  1,000 mg Intravenous Once     Home Medications:   Prescriptions Prior to Admission[3]        Vitals:   There were no vitals filed for this visit.    Labs:   No results found for: "UABHCG", "GLUCOSEWB"  Results       ** No results found for the last 24 hours. **                 No results found for: "HGB", "PLT"    No results found for: "NA", "CL", "CO2", "K", "CREAT", "BUN", "GLU"    No results found for: "AST", "ALT", "TROPI"    No results found for: "PT", "PTT", "INR"    No results found for: "COVID"    Rads:     Radiology Results (24 Hour)       ** No results found for the  last 24 hours. **                      Relevant Problems   No relevant active problems               Anesthesia Plan    ASA 2     general                     intravenous induction  Detailed anesthesia plan: general endotracheal        Post op pain management: PNB single shot    informed consent obtained    Plan discussed with CRNA.    ECG reviewed  pertinent labs reviewed      Patient has been off Ozempic for two weeks.           Signed by: Loleta Rose, MD 07/06/23 7:27 AM                 [1] There is no problem list on file for this patient.  [2] No Known Allergies  [3]   Medications Prior to Admission   Medication Sig Dispense Refill Last Dose    atorvastatin (LIPITOR) 20 MG tablet Take 1 tablet (20 mg) by mouth nightly   07/05/2023    metFORMIN (GLUCOPHAGE) 500 MG tablet Take 1 tablet (500 mg) by mouth daily   07/05/2023    olmesartan (BENICAR) 40 MG tablet Take 1 tablet (40 mg) by mouth nightly as directed   07/05/2023    Ozempic, 0.25 or 0.5 MG/DOSE, 2 MG/3ML Inject 0.25 mg into the skin Once each week on Friday morning   06/22/2023 at 1000    sildenafil (REVATIO) 20 MG tablet Take 1 tablet (20 mg) by mouth as needed   Past Month    testosterone cypionate (DEPO-TESTOSTERONE) 200 MG/ML injection INJECT INTRAMSCULAR TWICE A MONTH   06/29/2023

## 2023-07-06 NOTE — Interval H&P Note (Signed)
The patient was re-examined, The H&P was unchanged. The surgical site was marked by the patient and me.

## 2023-07-07 ENCOUNTER — Encounter: Payer: Self-pay | Admitting: Orthopaedic Surgery
